# Patient Record
Sex: Female | Born: 1970 | Race: Black or African American | Hispanic: No | Marital: Married
Health system: Southern US, Community
[De-identification: ages and names within clinical notes are randomized; demographics above are authoritative.]

---

## 2009-07-23 ENCOUNTER — Ambulatory Visit: Payer: Self-pay | Admitting: Obstetrics

## 2009-08-06 ENCOUNTER — Encounter: Payer: Self-pay | Admitting: Obstetrics and Gynecology

## 2009-10-29 ENCOUNTER — Encounter: Payer: Self-pay | Admitting: Obstetrics and Gynecology

## 2009-10-30 ENCOUNTER — Encounter: Payer: Self-pay | Admitting: Obstetrics and Gynecology

## 2009-12-31 ENCOUNTER — Inpatient Hospital Stay (HOSPITAL_COMMUNITY): Admission: AD | Admit: 2009-12-31 | Discharge: 2010-01-02 | Payer: Self-pay | Admitting: Obstetrics

## 2010-10-11 IMAGING — US US OB US >=[ID] SNGL FETUS
1 series · 16 of 28 positions shown · non-contrast
Comparison: none

REASON FOR EXAM: Size greater than date     Please fax report to   Fax
[phone]
COMMENTS:

[Series 1: us ob us >=(id) sngl fetus · 16 of 70 slices shown]
[im 1/70]
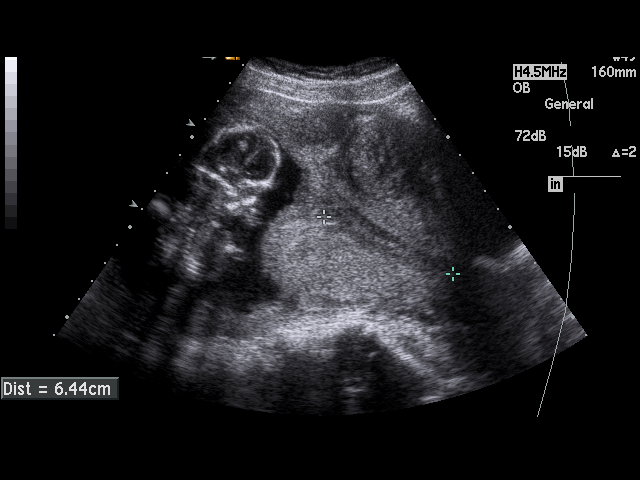
[im 6/70]
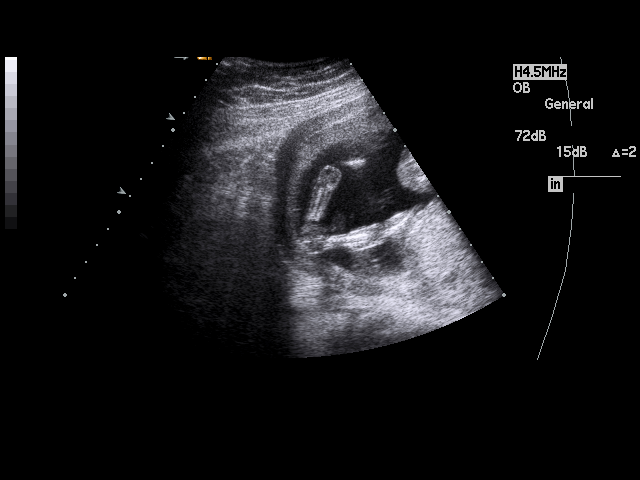
[im 11/70]
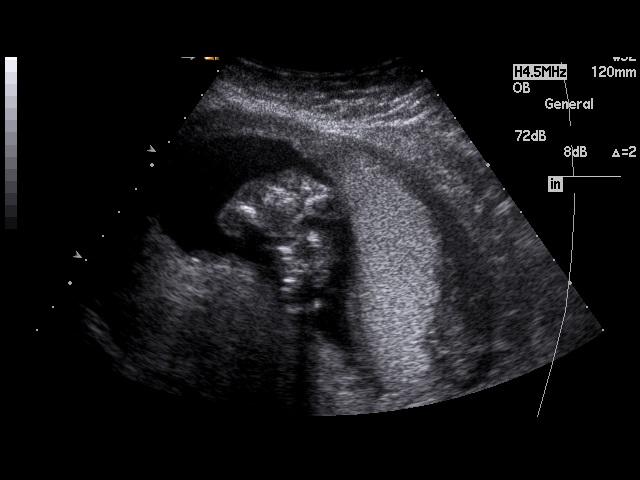
[im 16/70]
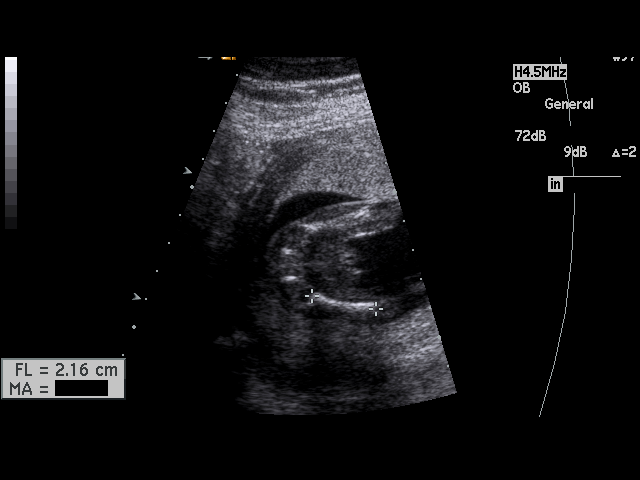
[im 18/70]
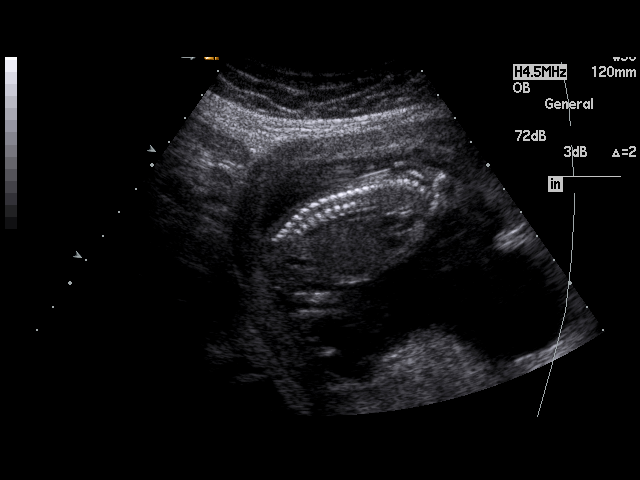
[im 24/70]
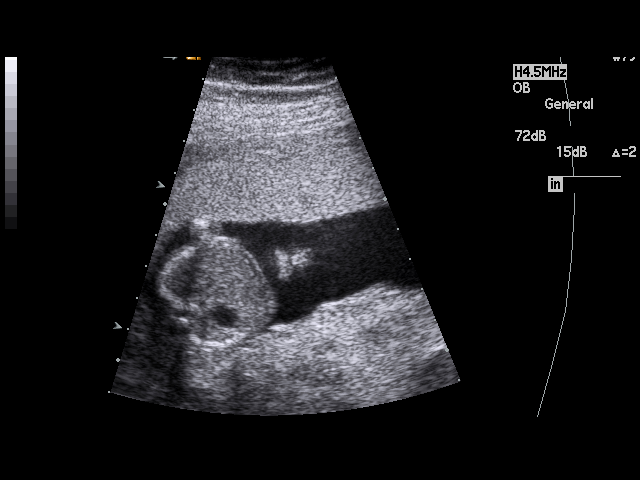
[im 29/70]
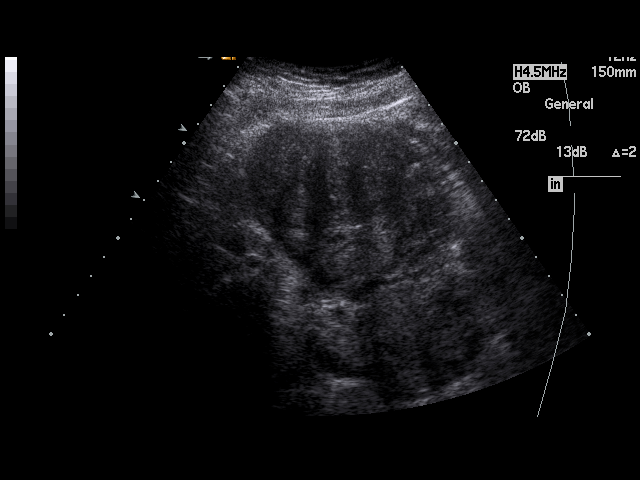
[im 34/70]
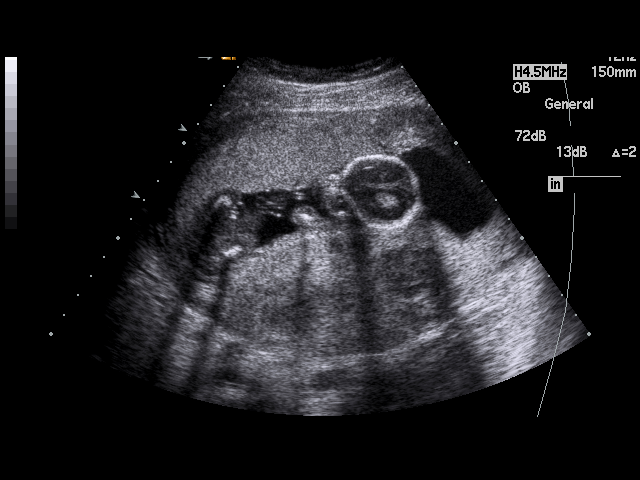
[im 36/70]
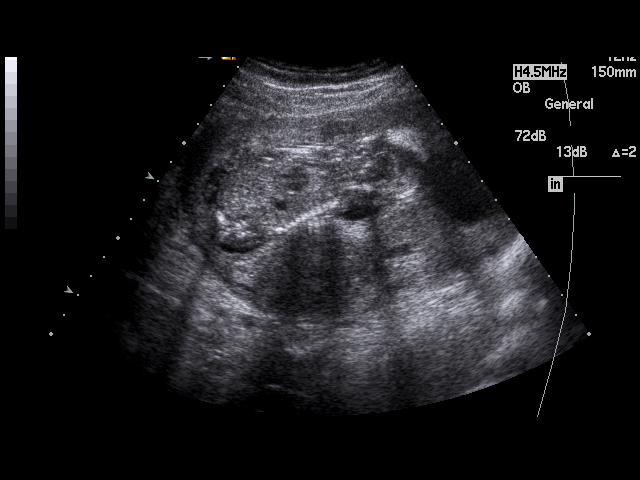
[im 41/70]
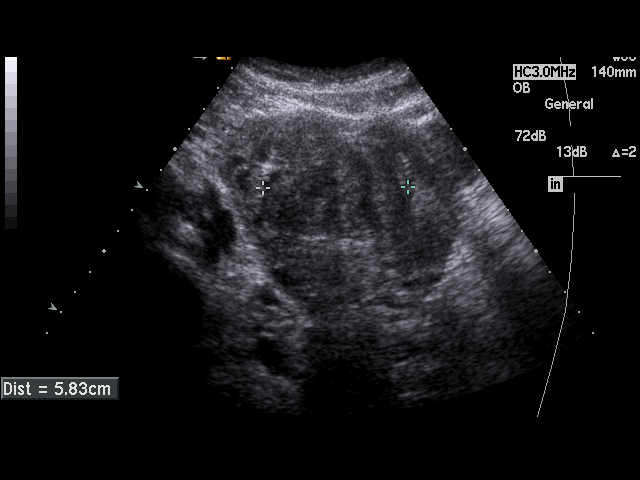
[im 47/70]
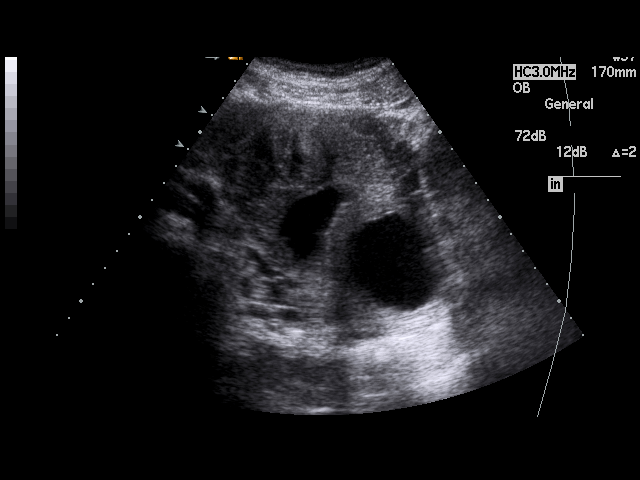
[im 52/70]
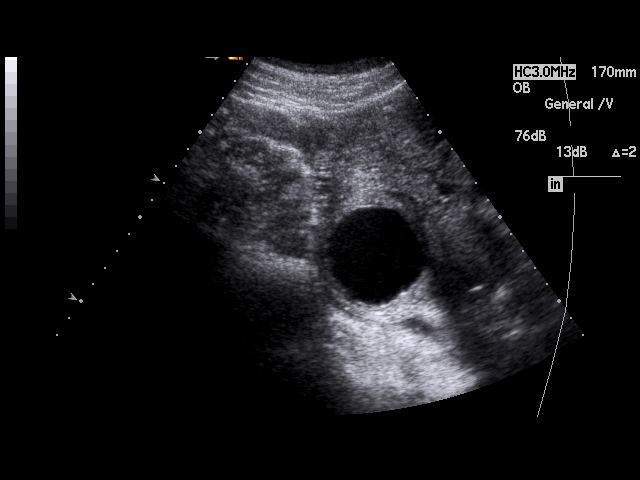
[im 54/70]
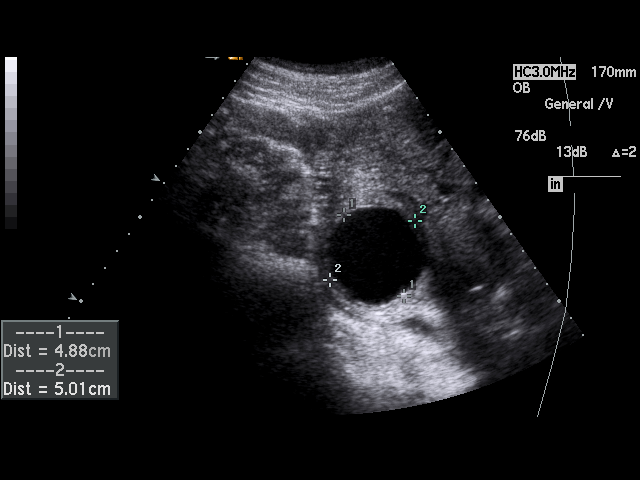
[im 59/70]
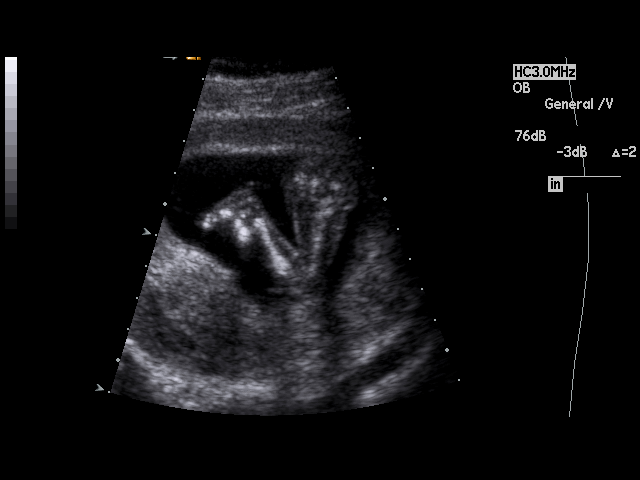
[im 64/70]
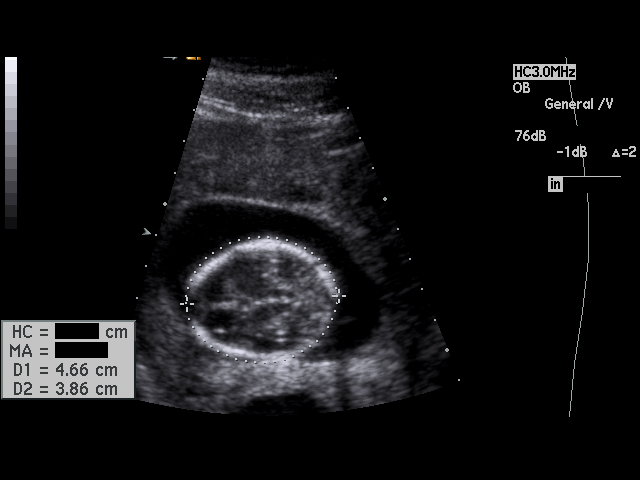
[im 70/70]
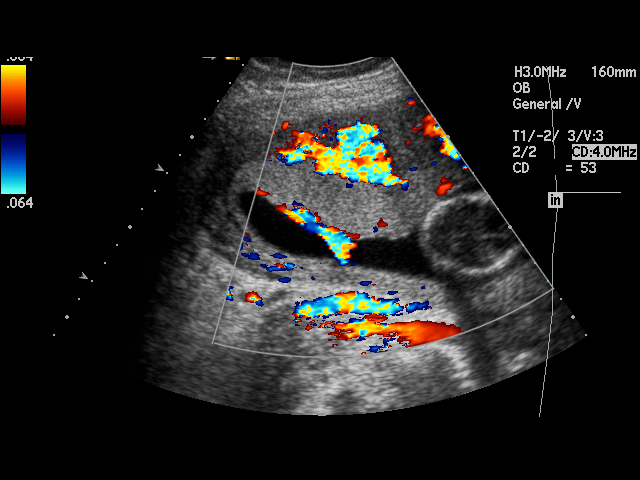

[16 of 28 positions shown; findings below may reference images not displayed]

PROCEDURE:     US  - US OB GREATER/OR EQUAL TO S1MI4  - July 23, 2009  [DATE]

RESULT:     There is observed single living intrauterine gestation.
Presentation is variable during the course of this exam. Amniotic fluid
volume appears normal. The placenta is anterior and high. The inferior
margin of the placenta is approximately 5.5 cm superior to the cervix. The
fetal heart, stomach and urinary bladder are visualized. No hydrocephalus or
hydronephrosis is seen. No fetal abnormalities are identified.

There are noted multiple uterine masses consistent with uterine fibroids.
The two largest measure approximately 5.8 and 5.2 cm at maximum diameter but
additional 2 to 4 cm masses are also present. The largest mass is located
anteriorly in the lower uterine segment and could well interfere with
vaginal delivery. It is recommended that the patient be seen in the high
risk neonatal clinic for additional evaluation. There is also noted a
cm cyst in the left adnexal area, likely representing a large ovarian cyst.

Fetal measurements are as follows:

The heart rate was monitored at 150 beats per minute.

          BPD is 3.48 cm, corresponding to 16 weeks, 5 days
          HC is 13.16 cm, corresponding to 16 weeks, 5 days
          AC is 10.95 cm, corresponding to 16 weeks, 6 days
          FL is 2.25 cm, corresponding to 16 weeks 5 days

EFW is 192 grams + / - 26 grams. Average ultrasound age is 16 weeks, 5 days.
Ultrasound EDD is 01/02/2010.
IMPRESSION: 1.  There is a single living intrauterine gestation of approximately 16
weeks, 5 days gestational age.
2.  Multiple uterine masses are seen with the largest measuring 5.8 cm and
located in the lower uterine segment. It is recommended the patient have
further evaluation at the high risk neonatal clinic.
3.  A left adnexal cyst is noted as mentioned above and likely represents an
ovarian cyst.
4.  Fetal measurements are as noted above.

## 2010-11-19 LAB — CBC
HCT: 28.7 % — ABNORMAL LOW (ref 36.0–46.0)
HCT: 31.3 % — ABNORMAL LOW (ref 36.0–46.0)
Hemoglobin: 10.7 g/dL — ABNORMAL LOW (ref 12.0–15.0)
Hemoglobin: 9.7 g/dL — ABNORMAL LOW (ref 12.0–15.0)
MCHC: 33.7 g/dL (ref 30.0–36.0)
Platelets: 240 10*3/uL (ref 150–400)
RBC: 3.35 MIL/uL — ABNORMAL LOW (ref 3.87–5.11)
RDW: 15.9 % — ABNORMAL HIGH (ref 11.5–15.5)
WBC: 7.8 10*3/uL (ref 4.0–10.5)

## 2010-11-19 LAB — COMPREHENSIVE METABOLIC PANEL
Albumin: 2.8 g/dL — ABNORMAL LOW (ref 3.5–5.2)
Alkaline Phosphatase: 231 U/L — ABNORMAL HIGH (ref 39–117)
BUN: 5 mg/dL — ABNORMAL LOW (ref 6–23)
CO2: 23 mEq/L (ref 19–32)
Chloride: 107 mEq/L (ref 96–112)
Glucose, Bld: 80 mg/dL (ref 70–99)
Potassium: 3.8 mEq/L (ref 3.5–5.1)
Total Bilirubin: 0.1 mg/dL — ABNORMAL LOW (ref 0.3–1.2)

## 2010-11-19 LAB — RPR: RPR Ser Ql: NONREACTIVE

## 2011-01-17 IMAGING — US US OB FOLLOW-UP - [facility]PT [facility]
1 series · 14 of 28 positions shown · non-contrast
Comparison: none

[Series 1: us ob follow-up - [facility]pt [facility] · 14 of 40 slices shown]
[im 2/40]
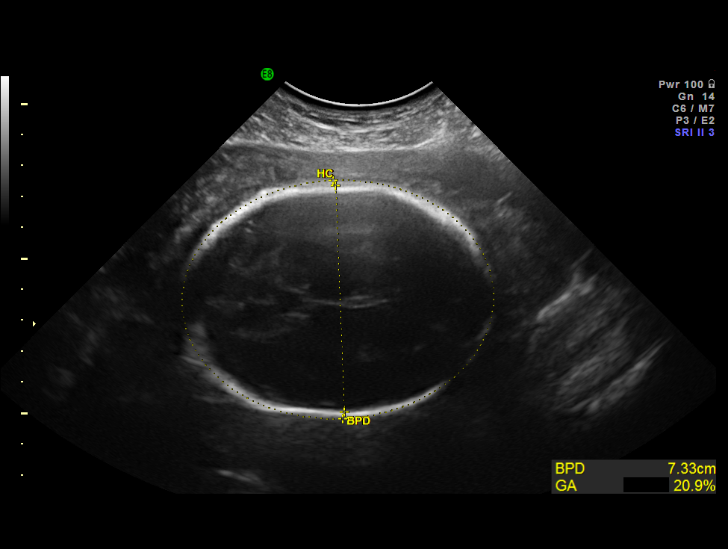
[im 5/40]
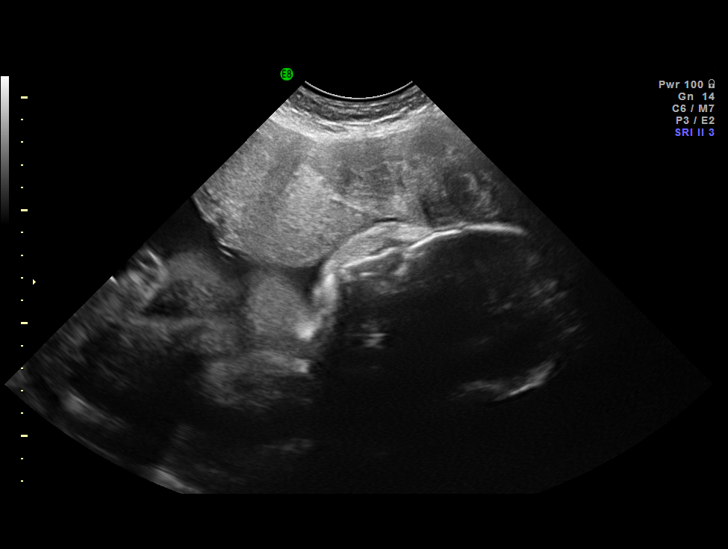
[im 8/40]
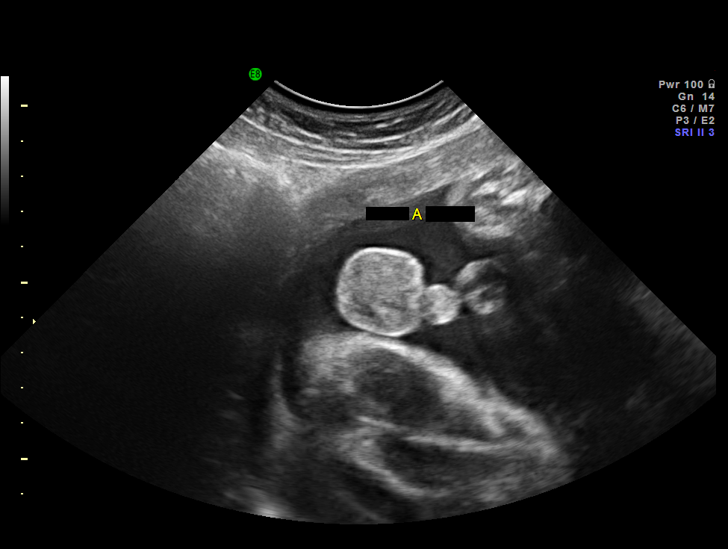
[im 11/40]
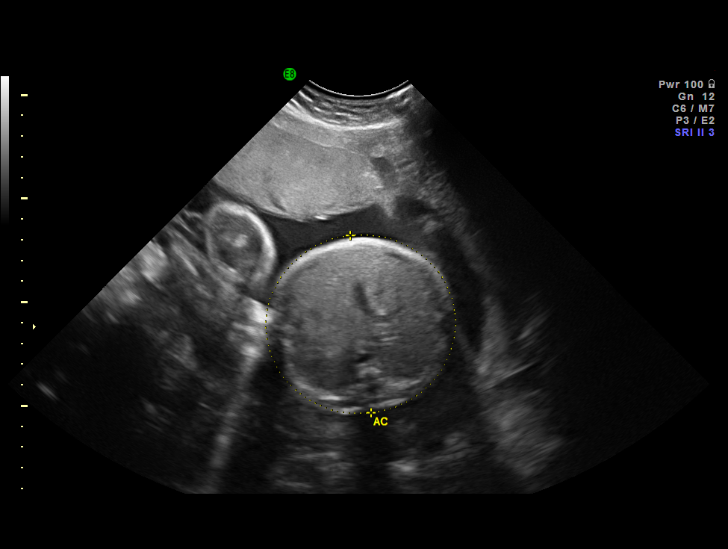
[im 14/40]
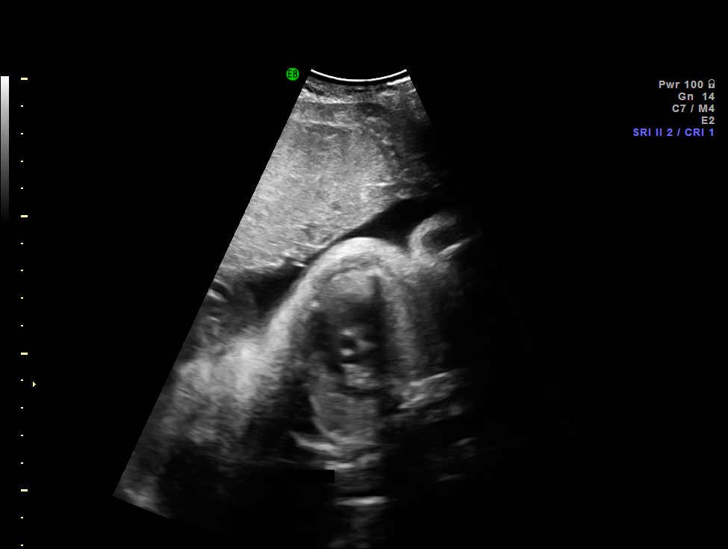
[im 16/40]
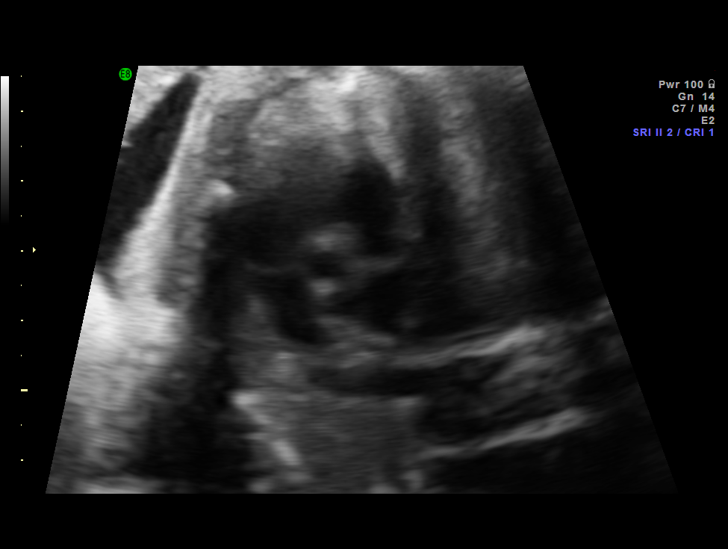
[im 19/40]
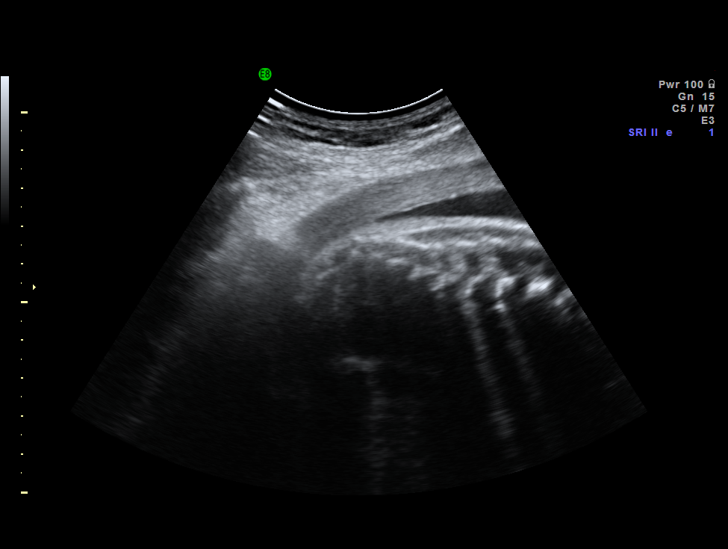
[im 22/40]
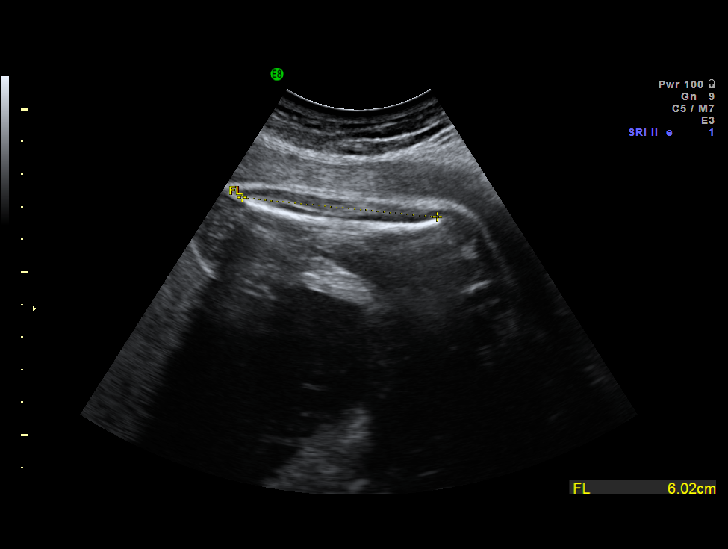
[im 25/40]
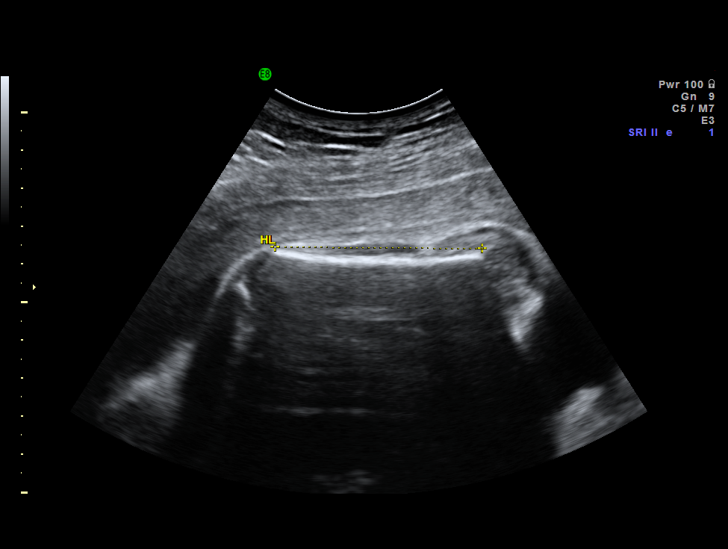
[im 28/40]
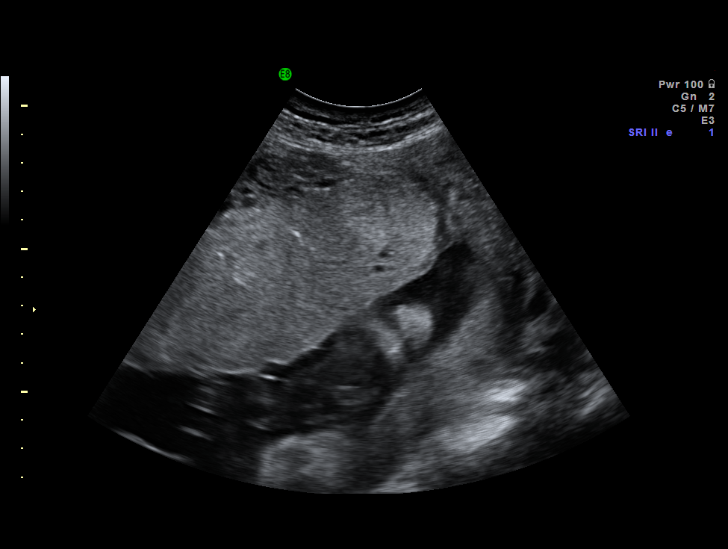
[im 31/40]
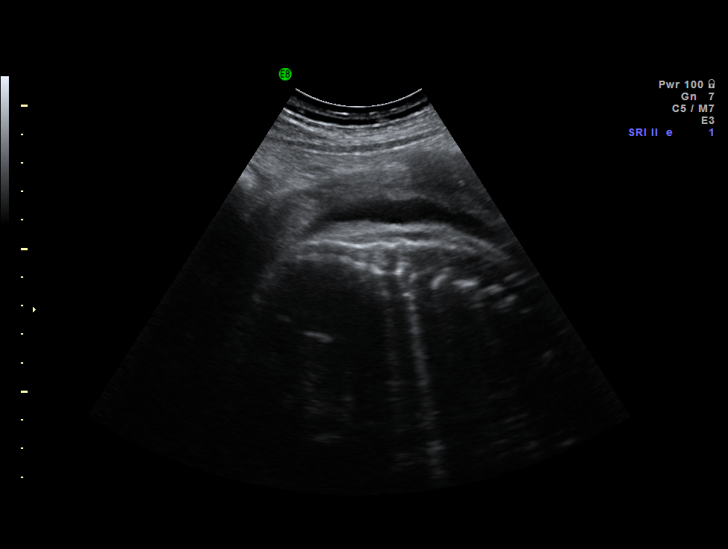
[im 34/40]
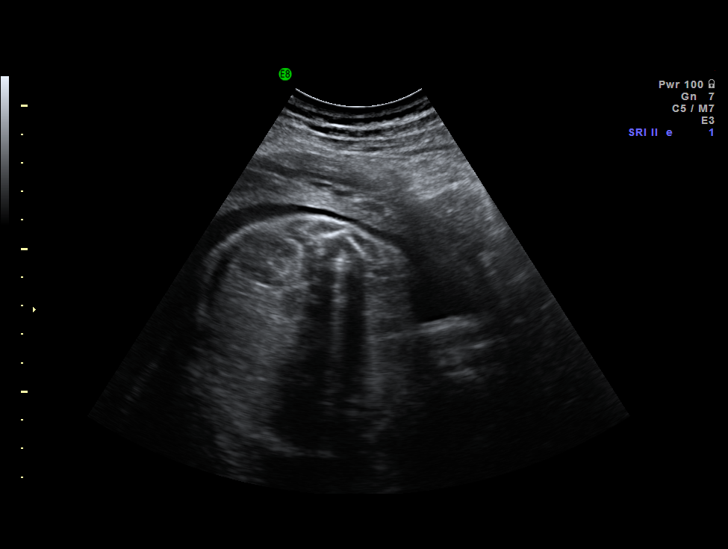
[im 37/40]
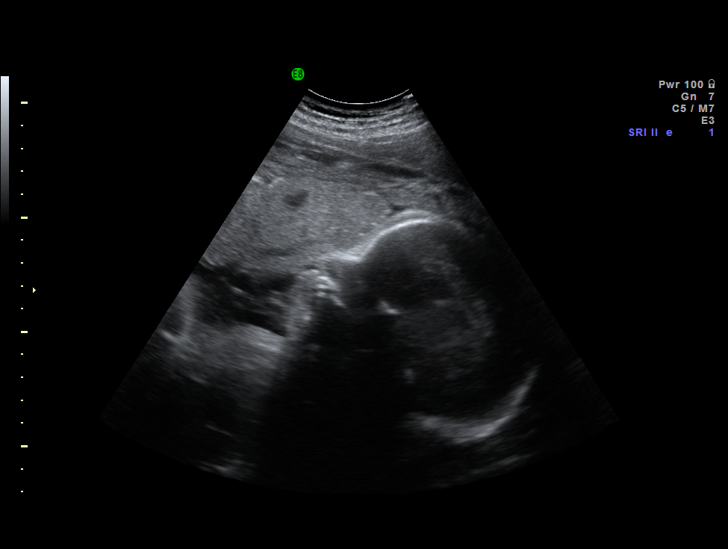
[im 40/40]
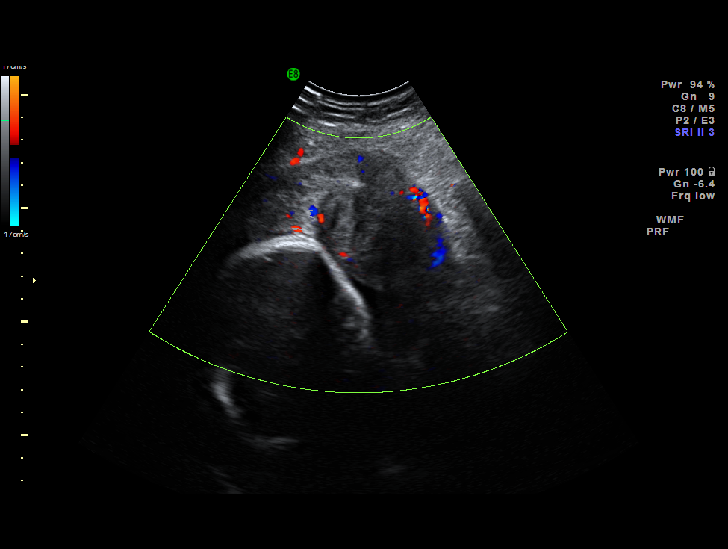

[14 of 28 positions shown; findings below may reference images not displayed]

IMAGES IMPORTED FROM THE [employer] WORKFLOW SYSTEM
NO DICTATION FOR STUDY

## 2023-03-22 ENCOUNTER — Emergency Department (HOSPITAL_COMMUNITY)
Admission: EM | Admit: 2023-03-22 | Discharge: 2023-03-22 | Discharge status: Against Medical Advice | Attending: Emergency Medicine | Admitting: Emergency Medicine

## 2023-03-22 ENCOUNTER — Other Ambulatory Visit: Payer: Self-pay

## 2023-03-22 DIAGNOSIS — R21 Rash and other nonspecific skin eruption: Secondary | ICD-10-CM | POA: Insufficient documentation

## 2023-03-22 DIAGNOSIS — T781XXA Other adverse food reactions, not elsewhere classified, initial encounter: Secondary | ICD-10-CM | POA: Diagnosis not present

## 2023-03-22 LAB — CBC WITH DIFFERENTIAL/PLATELET
Abs Immature Granulocytes: 0.01 10*3/uL (ref 0.00–0.07)
Basophils Absolute: 0.1 10*3/uL (ref 0.0–0.1)
Basophils Relative: 1 %
Eosinophils Absolute: 0.5 10*3/uL (ref 0.0–0.5)
Eosinophils Relative: 10 %
HCT: 39.5 % (ref 36.0–46.0)
Hemoglobin: 12.7 g/dL (ref 12.0–15.0)
Immature Granulocytes: 0 %
Lymphocytes Relative: 55 %
Lymphs Abs: 3 10*3/uL (ref 0.7–4.0)
MCH: 29.3 pg (ref 26.0–34.0)
MCHC: 32.2 g/dL (ref 30.0–36.0)
MCV: 91.2 fL (ref 80.0–100.0)
Monocytes Absolute: 0.5 10*3/uL (ref 0.1–1.0)
Monocytes Relative: 10 %
Neutro Abs: 1.3 10*3/uL — ABNORMAL LOW (ref 1.7–7.7)
Neutrophils Relative %: 24 %
Platelets: 331 10*3/uL (ref 150–400)
RBC: 4.33 MIL/uL (ref 3.87–5.11)
RDW: 13.5 % (ref 11.5–15.5)
WBC: 5.4 10*3/uL (ref 4.0–10.5)
nRBC: 0 % (ref 0.0–0.2)

## 2023-03-22 LAB — COMPREHENSIVE METABOLIC PANEL
ALT: 23 U/L (ref 0–44)
AST: 26 U/L (ref 15–41)
Albumin: 4.1 g/dL (ref 3.5–5.0)
Alkaline Phosphatase: 76 U/L (ref 38–126)
Anion gap: 8 (ref 5–15)
BUN: 17 mg/dL (ref 6–20)
CO2: 25 mmol/L (ref 22–32)
Calcium: 9.2 mg/dL (ref 8.9–10.3)
Chloride: 108 mmol/L (ref 98–111)
Creatinine, Ser: 1.14 mg/dL — ABNORMAL HIGH (ref 0.44–1.00)
GFR, Estimated: 58 mL/min — ABNORMAL LOW (ref >60)
Glucose, Bld: 96 mg/dL (ref 70–99)
Potassium: 3.6 mmol/L (ref 3.5–5.1)
Sodium: 141 mmol/L (ref 135–145)
Total Bilirubin: 0.5 mg/dL (ref 0.3–1.2)
Total Protein: 7.7 g/dL (ref 6.5–8.1)

## 2023-03-22 MED ORDER — METHYLPREDNISOLONE SODIUM SUCC 125 MG IJ SOLR
125.0000 mg | Freq: Once | INTRAMUSCULAR | Status: AC
  Administered 2023-03-22: 125 mg via INTRAVENOUS
  Filled 2023-03-22: qty 2

## 2023-03-22 MED ORDER — DIPHENHYDRAMINE HCL 50 MG/ML IJ SOLN
25.0000 mg | Freq: Once | INTRAMUSCULAR | Status: AC
  Administered 2023-03-22: 25 mg via INTRAVENOUS
  Filled 2023-03-22: qty 1

## 2023-03-22 MED ORDER — FAMOTIDINE IN NACL 20-0.9 MG/50ML-% IV SOLN
20.0000 mg | Freq: Once | INTRAVENOUS | Status: AC
  Administered 2023-03-22: 20 mg via INTRAVENOUS
  Filled 2023-03-22: qty 50

## 2023-03-22 MED ORDER — EPINEPHRINE 0.3 MG/0.3ML IJ SOAJ: 0.3000 mg | INTRAMUSCULAR | 0 refills | Status: AC | PRN

## 2023-03-22 MED ORDER — EPINEPHRINE 0.3 MG/0.3ML IJ SOAJ
0.3000 mg | Freq: Once | INTRAMUSCULAR | Status: AC
  Administered 2023-03-22: 0.3 mg via INTRAMUSCULAR
  Filled 2023-03-22: qty 0.3

## 2023-03-22 NOTE — ED Triage Notes (Signed)
Pt. Arrives POV for an allergic rxn. Pt. Ate trout yesterday. This morning she woke up with her mouth being swollen and her being unable to swallow. Pt. Took 50 mg of benadryl at home. Swelling initially went down. She then took 2 sudafed pills.

## 2023-03-22 NOTE — Discharge Instructions (Addendum)
You were seen in the emergency department for your allergic reaction.  We gave you an EpiPen, Benadryl, steroids and Pepcid with improvement of your symptoms.  You were recommended to be watched for 4 hours for risk of a recurrent reaction and you left AGAINST MEDICAL ADVICE.  You are at risk for your symptoms returning which could include tongue or throat swelling, shortness of breath which could lead to low blood pressure or even death.  I have sent you with a EpiPen that you can fill and use as needed.  You can follow-up with your primary doctor to have your symptoms rechecked.  You can return to the emergency department at any time for any new or concerning symptoms.

## 2023-03-22 NOTE — ED Provider Notes (Signed)
Patient is a 52 year old female with a past medical history of fish allergy presenting to the emergency department with concern for allergic reaction.  The patient states that she was exposed to some seafood yesterday afternoon and broke out in hives and has a feeling of ants crawling around her wrist.  She states that she also feels like she is having trouble swallowing with throat swelling.  She was hemodynamically stable on arrival here and was given Benadryl, Solu-Medrol, EpiPen and Pepcid.  She was signed out to me at 0700 by Dr. Preston Fleeting pending reassessment.  On my evaluation, the patient is awake and alert reporting improvement of her symptoms.  Lungs are clear to auscultation bilaterally, she has no stridor, no evidence of any oral swelling, no rash seen on exam.  The patient will continue to be monitored for total of 4 hours after her medication to evaluate for any rebound reaction.  8:51 AM The patient has decided to leave against medical advice. The patient had a normal mental status examination and understands her condition and the risks of leaving including recurrent reaction with tongue or throat swelling, shortness of breath,  including permanent disability and death. The patient has had an opportunity to ask  questions about his/her medical condition. The patient has been informed that she may return for care at any time and has been referred to her physician.     Rexford Maus, DO 03/22/23 [phone]

## 2023-03-22 NOTE — ED Provider Notes (Signed)
[place] EMERGENCY DEPARTMENT AT [facility] Provider Note   CSN: [id number] Arrival date & time: 03/22/23  0541     History  Chief Complaint  Patient presents with   Allergic Reaction    Lindsay Bush is a 52 y.o. female.  The history is provided by the patient.  Allergic Reaction She has seafood allergy and states that she ate some ground yesterday at about 3 PM and about 4:30 PM and noted that she was having difficulty swallowing and is breaking out in hives.  She took some diphenhydramine which did give her some relief.  She repeated diphenhydramine at about 11 PM.  She comes in now continuing to complain of hives with generalized itching and with feeling that she cannot swallow.  She does note that she has a goiter which tends to cause problems with swallowing.  She has had similar reactions in the past to both flounder as well as shellfish.   Home Medications Prior to Admission medications   Medication Sig Start Date End Date Taking? Authorizing Provider  amLODipine (NORVASC) 10 MG tablet  10/11/21  Yes [provider]  clonazePAM (KLONOPIN) 0.5 MG tablet  08/19/22  Yes [provider]  fexofenadine (ALLEGRA) 180 MG tablet Take by mouth. 07/28/18  Yes [provider]      Allergies    Fish allergy, Lisinopril, and Shellfish allergy    Review of Systems   Review of Systems  All other systems reviewed and are negative.   Physical Exam Updated Vital Signs BP (!) 140/93   Pulse 83   Temp 97.7 F (36.5 C) (Oral)   Resp 20   Ht 5\' 10"  (1.778 m)   Wt 80.7 kg   SpO2 99%   BMI 25.54 kg/m  Physical Exam Vitals and nursing note reviewed.   52 year old female, resting comfortably and in no acute distress. Vital signs are significant for borderline elevated blood pressure. Oxygen saturation is 99%, which is normal. Head is normocephalic and atraumatic. PERRLA, EOMI. Oropharynx is mildly erythematous.  There is no swelling of the  tongue or sublingual tissue or soft tissues of the pharynx or soft palate.  There is no pooling of secretions.  Phonation is normal. Neck is nontender and supple without adenopathy or JVD.  Thyroid is symmetrically enlarged.  There is no stridor Back is nontender and there is no CVA tenderness. Lungs are clear without rales, wheezes, or rhonchi. Chest is nontender. Heart has regular rate and rhythm without murmur. Abdomen is soft, flat, nontender. Extremities have no cyanosis or edema, full range of motion is present. Skin is warm and dry.  There is a very faint erythematous rash diffusely, but no discrete urticarial lesions seen. Neurologic: Mental status is normal, cranial nerves are intact, moves all extremities equally.  ED Results / Procedures / Treatments   Labs (all labs ordered are listed, but only abnormal results are displayed) Labs Reviewed  CBC WITH DIFFERENTIAL/PLATELET - Abnormal; Notable for the following components:      Result Value   Neutro Abs 1.3 (*)    All other components within normal limits  COMPREHENSIVE METABOLIC PANEL   Procedures Procedures  Cardiac monitor shows normal sinus rhythm, per my interpretation.  Medications Ordered in ED Medications  EPINEPHrine (EPI-PEN) injection 0.3 mg (has no administration in time range)  methylPREDNISolone sodium succinate (SOLU-MEDROL) 125 mg/2 mL injection 125 mg (has no administration in time range)  diphenhydrAMINE (BENADRYL) injection 25 mg (has no administration  in time range)  famotidine (PEPCID) IVPB 20 mg premix (has no administration in time range)    ED Course/ Medical Decision Making/ A&P                             Medical Decision Making Amount and/or Complexity of Data Reviewed Labs: ordered.  Risk Prescription drug management.   Acute allergic reaction to fish with rash and difficulty swallowing.  Airway seems to be protected well.  I have ordered intramuscular epinephrine as well as  intravenous diphenhydramine, famotidine, diphenhydramine.  I reviewed her past records, and she had an office visit on 05/06/2022 with her endocrinologist who reports thyroid diffusely enlarged.  7:15 AM I have reviewed and interpreted her laboratory test, and my interpretation is minimal elevation of creatinine of uncertain clinical significance, normal CBC.  Patient has been stable thus far.  She will need to be observed for total of 4 hours.  Case is signed out to Dr. Theresia Lo.  CRITICAL CARE Performed by: Dione Booze Total critical care time: 45 minutes Critical care time was exclusive of separately billable procedures and treating other patients. Critical care was necessary to treat or prevent imminent or life-threatening deterioration. Critical care was time spent personally by me on the following activities: development of treatment plan with patient and/or surrogate as well as nursing, discussions with consultants, evaluation of patient's response to treatment, examination of patient, obtaining history from patient or surrogate, ordering and performing treatments and interventions, ordering and review of laboratory studies, ordering and review of radiographic studies, pulse oximetry and re-evaluation of patient's condition. Final Clinical Impression(s) / ED Diagnoses Final diagnoses:  Allergic reaction to food, initial encounter    Rx / DC Orders ED Discharge Orders     None         Dione Booze, MD 03/22/23 [phone]

## 2023-03-22 NOTE — ED Notes (Signed)
Patient's phone is charging at the charge nurse desk. Patient's phone has her patient label on it. This RN told patient to use her call button if she needs her phone prior to phone being fully charged.
